# Patient Record
Sex: Male | Born: 1975 | Race: White | Hispanic: Yes | Marital: Single
Health system: Southern US, Community
[De-identification: ages and names within clinical notes are randomized; demographics above are authoritative.]

## PROBLEM LIST (undated history)

## (undated) ENCOUNTER — Emergency Department (HOSPITAL_COMMUNITY): Admission: EM | Payer: Self-pay | Source: Home / Self Care

---

## 2005-06-09 ENCOUNTER — Encounter (INDEPENDENT_AMBULATORY_CARE_PROVIDER_SITE_OTHER): Payer: Self-pay | Admitting: Specialist

## 2005-06-09 ENCOUNTER — Inpatient Hospital Stay (HOSPITAL_COMMUNITY): Admission: EM | Admit: 2005-06-09 | Discharge: 2005-06-14 | Payer: Self-pay | Admitting: Emergency Medicine

## 2005-06-22 ENCOUNTER — Emergency Department (HOSPITAL_COMMUNITY): Admission: EM | Admit: 2005-06-22 | Discharge: 2005-06-22 | Payer: Self-pay | Admitting: Emergency Medicine

## 2005-07-31 ENCOUNTER — Encounter: Admission: RE | Admit: 2005-07-31 | Discharge: 2005-08-29 | Payer: Self-pay | Admitting: Physician Assistant

## 2006-11-11 ENCOUNTER — Emergency Department (HOSPITAL_COMMUNITY): Admission: EM | Admit: 2006-11-11 | Discharge: 2006-11-11 | Payer: Self-pay | Admitting: Emergency Medicine
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
## 2007-09-09 ENCOUNTER — Encounter: Admission: RE | Admit: 2007-09-09 | Discharge: 2007-09-09 | Payer: Self-pay | Admitting: Gastroenterology

## 2009-04-26 ENCOUNTER — Emergency Department (HOSPITAL_COMMUNITY): Admission: EM | Admit: 2009-04-26 | Discharge: 2009-04-26 | Payer: Self-pay | Admitting: Emergency Medicine

## 2009-06-15 IMAGING — US US ABDOMEN COMPLETE
1 series · 14 of 25 positions shown · non-contrast
Comparison: None

CLINICAL DATA: Elevated LFTs

ABDOMEN ULTRASOUND
TECHNIQUE: Complete abdominal ultrasound examination was performed
including evaluation of the liver, gallbladder, bile ducts,
pancreas, kidneys, spleen, IVC, and abdominal aorta.

[Series 1: us abdomen complete · 0.33mm/px · 14 of 62 slices shown]
[im 1/62]
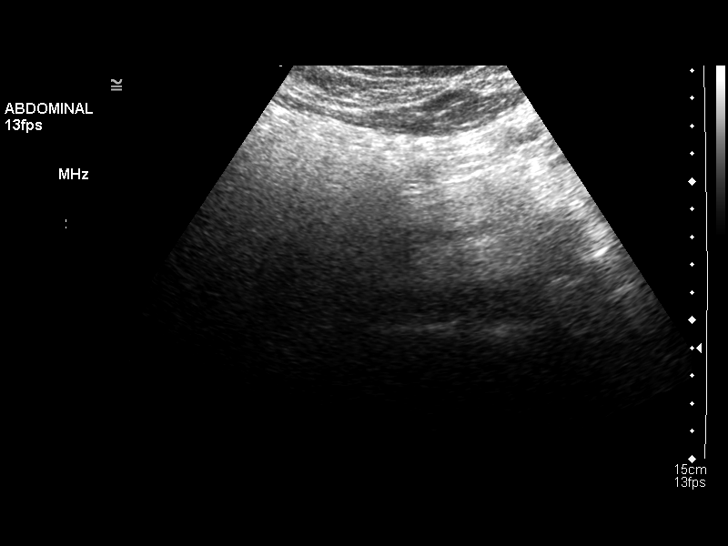
[im 6/62]
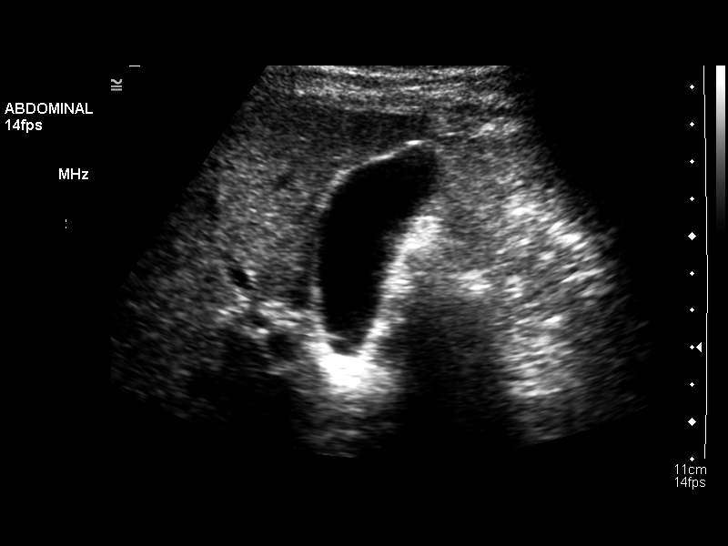
[im 11/62]
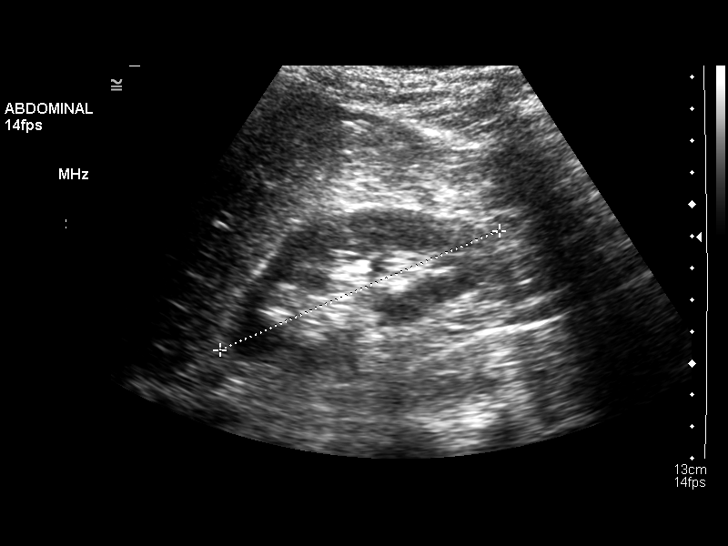
[im 16/62]
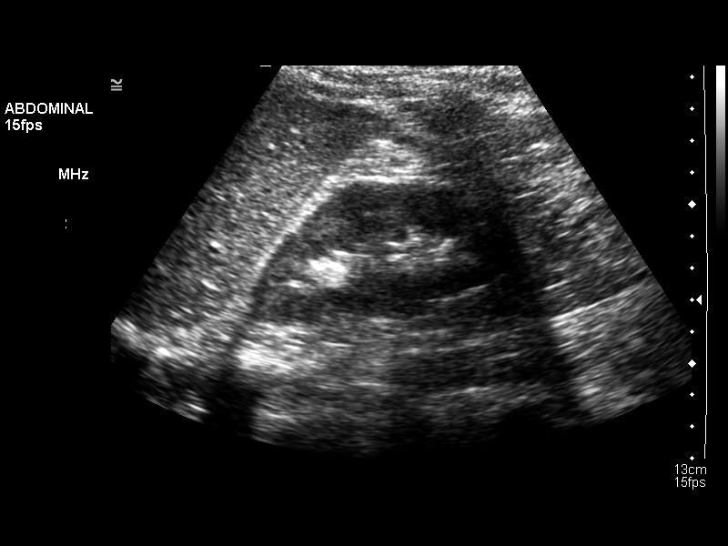
[im 21/62]
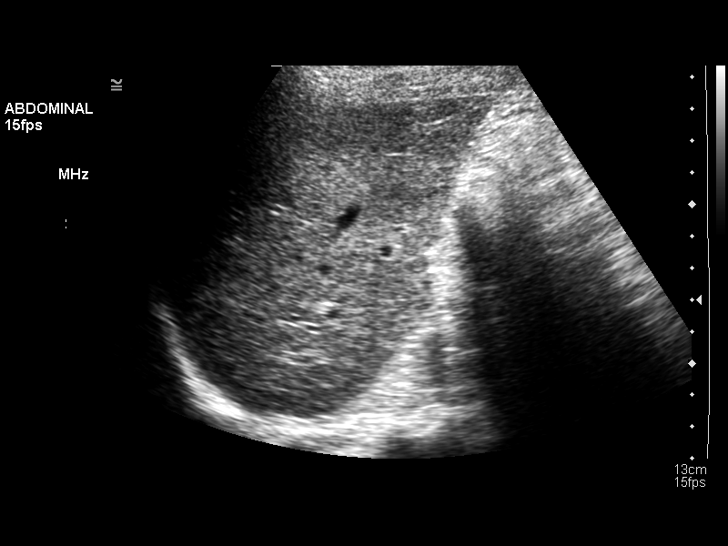
[im 23/62]
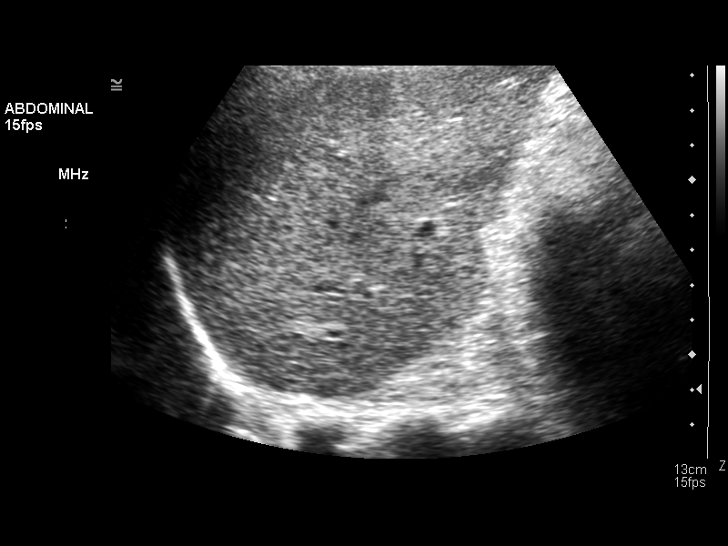
[im 28/62]
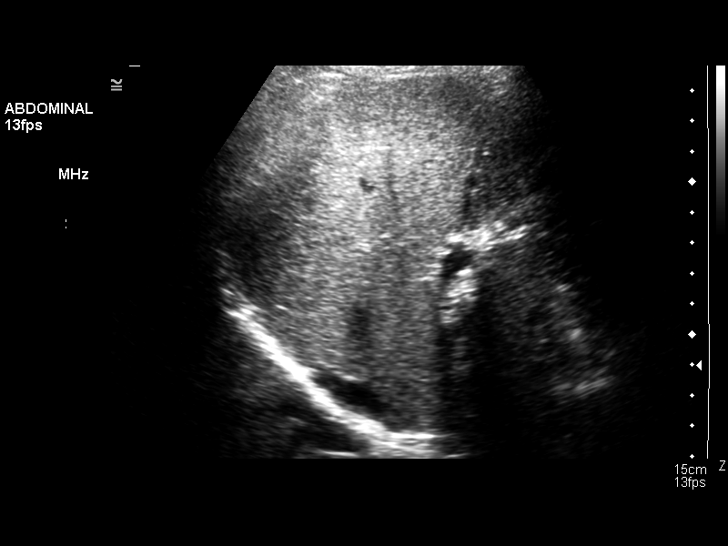
[im 34/62]
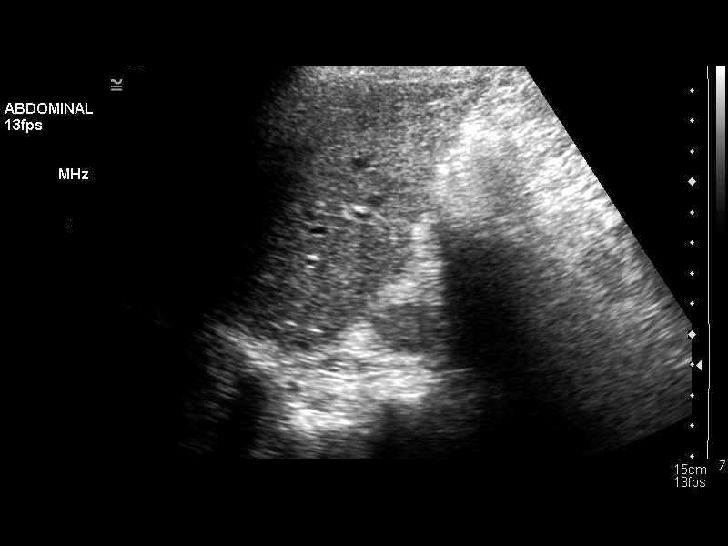
[im 39/62]
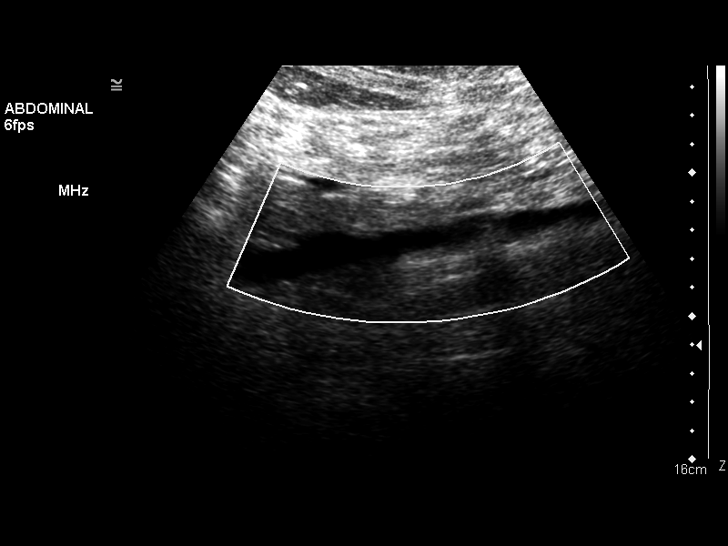
[im 41/62]
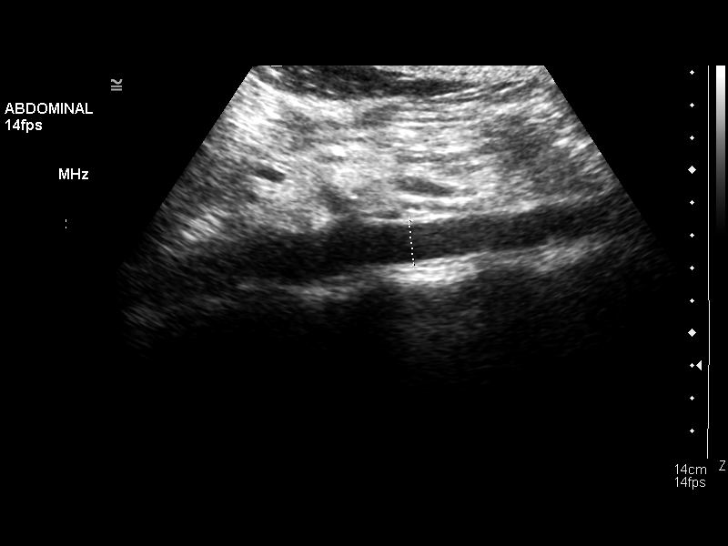
[im 46/62]
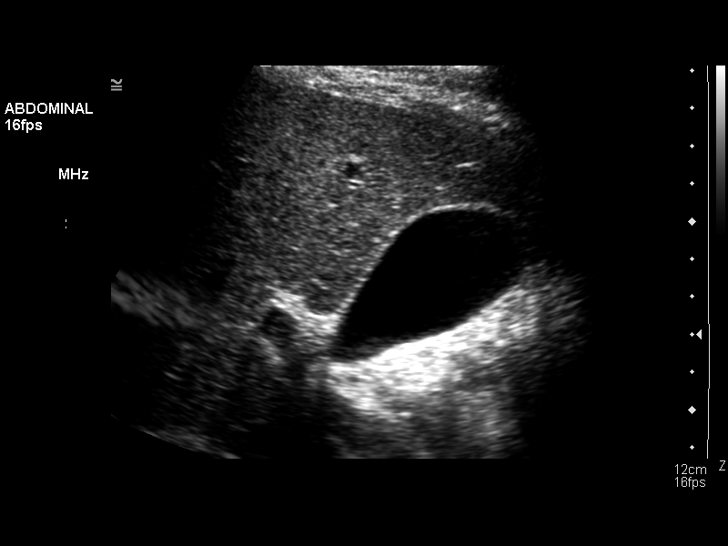
[im 51/62]
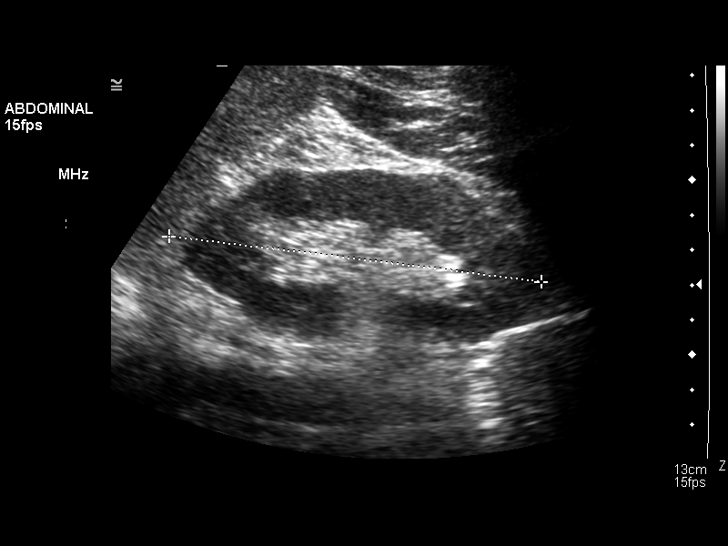
[im 56/62]
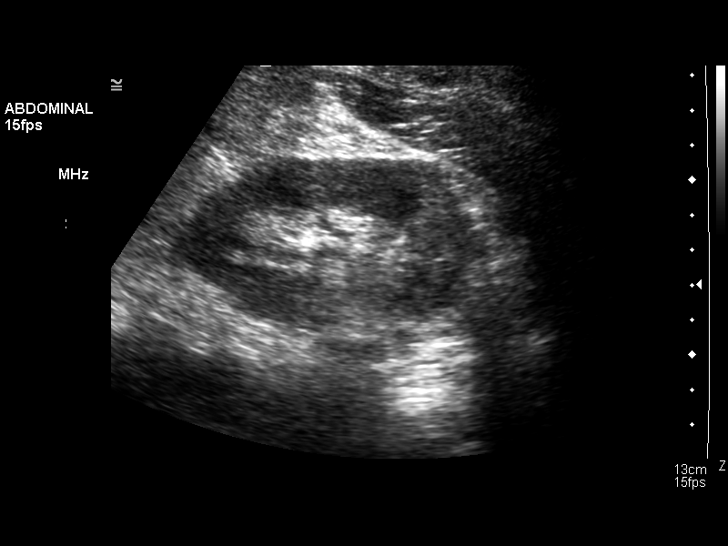
[im 62/62]
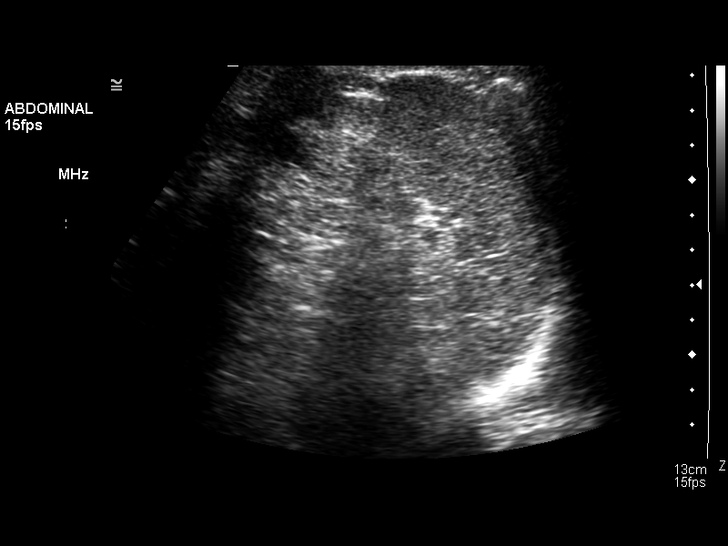

[14 of 25 positions shown; findings below may reference images not displayed]

FINDINGS: The gallbladder is normal.  Gallbladder wall thickness is
2.7 mm.  No biliary ductal dilatation.  Common duct measures 3 mm
which is well within normal limits.  Diffuse increased hepatic
echogenicity is compatible with fatty infiltration of the liver.
Patent IVC.  Poor visualization of the pancreas due to adjacent
bowel gas.  Spleen appears unremarkable measuring 8.9 cm in length.
The right and left kidneys measure 9.9 cm and 10.5 cm in length,
respectively.  Maximum diameter of the abdominal aorta is 1.9 cm.
IMPRESSION: Fatty infiltration of the liver.  Normal gallbladder.  No biliary
ductal dilatation.
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
## 2019-02-05 ENCOUNTER — Other Ambulatory Visit: Payer: Self-pay

## 2019-02-05 DIAGNOSIS — Z20822 Contact with and (suspected) exposure to covid-19: Secondary | ICD-10-CM

## 2019-02-06 ENCOUNTER — Telehealth: Payer: Self-pay

## 2019-02-06 NOTE — Telephone Encounter (Signed)
Patient called in requesting COVID19 lab results  - DOB/Address verified - results pending. Reviewed testing process with patient, no further questions. 

## 2019-02-07 LAB — NOVEL CORONAVIRUS, NAA: SARS-CoV-2, NAA: NOT DETECTED
